# Patient Record
Sex: Female | Born: 1960 | Race: White | Hispanic: No | Marital: Single | Smoking: Never smoker
Health system: Southern US, Community
[De-identification: ages and names within clinical notes are randomized; demographics above are authoritative.]

## PROBLEM LIST (undated history)

## (undated) DIAGNOSIS — B029 Zoster without complications: Secondary | ICD-10-CM

## (undated) DIAGNOSIS — F32A Depression, unspecified: Secondary | ICD-10-CM

## (undated) DIAGNOSIS — T7840XA Allergy, unspecified, initial encounter: Secondary | ICD-10-CM

## (undated) DIAGNOSIS — F329 Major depressive disorder, single episode, unspecified: Secondary | ICD-10-CM

## (undated) DIAGNOSIS — K635 Polyp of colon: Secondary | ICD-10-CM

## (undated) DIAGNOSIS — E0500 Thyrotoxicosis with diffuse goiter without thyrotoxic crisis or storm: Secondary | ICD-10-CM

## (undated) HISTORY — DX: Thyrotoxicosis with diffuse goiter without thyrotoxic crisis or storm: E05.00

## (undated) HISTORY — DX: Zoster without complications: B02.9

## (undated) HISTORY — DX: Major depressive disorder, single episode, unspecified: F32.9

## (undated) HISTORY — DX: Depression, unspecified: F32.A

## (undated) HISTORY — DX: Polyp of colon: K63.5

## (undated) HISTORY — DX: Allergy, unspecified, initial encounter: T78.40XA

---

## 2001-05-16 ENCOUNTER — Other Ambulatory Visit: Admission: RE | Admit: 2001-05-16 | Discharge: 2001-05-16 | Payer: Self-pay | Admitting: *Deleted

## 2008-03-26 ENCOUNTER — Other Ambulatory Visit: Admission: RE | Admit: 2008-03-26 | Discharge: 2008-03-26 | Payer: Self-pay | Admitting: Obstetrics and Gynecology

## 2008-04-03 ENCOUNTER — Encounter (HOSPITAL_COMMUNITY): Admission: RE | Admit: 2008-04-03 | Discharge: 2008-07-02 | Payer: Self-pay | Admitting: Endocrinology

## 2008-04-21 ENCOUNTER — Ambulatory Visit (HOSPITAL_COMMUNITY): Admission: RE | Admit: 2008-04-21 | Discharge: 2008-04-21 | Payer: Self-pay | Admitting: Endocrinology

## 2009-01-27 ENCOUNTER — Emergency Department (HOSPITAL_COMMUNITY): Admission: EM | Admit: 2009-01-27 | Discharge: 2009-01-27 | Payer: Self-pay | Admitting: Family Medicine

## 2010-06-15 LAB — HCG, SERUM, QUALITATIVE

## 2010-12-06 ENCOUNTER — Other Ambulatory Visit: Payer: Self-pay | Admitting: Occupational Medicine

## 2010-12-06 ENCOUNTER — Ambulatory Visit: Payer: Self-pay

## 2010-12-06 DIAGNOSIS — R52 Pain, unspecified: Secondary | ICD-10-CM

## 2011-02-14 ENCOUNTER — Ambulatory Visit (INDEPENDENT_AMBULATORY_CARE_PROVIDER_SITE_OTHER): Payer: BC Managed Care – PPO | Admitting: Medical

## 2011-02-14 ENCOUNTER — Encounter: Payer: Self-pay | Admitting: Medical

## 2011-02-14 DIAGNOSIS — Z0000 Encounter for general adult medical examination without abnormal findings: Secondary | ICD-10-CM | POA: Insufficient documentation

## 2011-02-14 DIAGNOSIS — Z1239 Encounter for other screening for malignant neoplasm of breast: Secondary | ICD-10-CM

## 2011-02-14 NOTE — Progress Notes (Signed)
Subjective:   HPI  Lydia Tanner is a 50 y.o. female who presents for a complete physical.  Here as a new patient today. Referred by patient Pervis Hocking, RN.  She is a Presenter, broadcasting.  She hasn't had primary care provider routinely.  She does and has seen Dr. Talmage Nap for quite sometime for hx/o thyroid disease.  She has never had a mammogram and is not UTD on pap smear.  She is menstruating today though.  She notes hx/o normal pap smears.  She notes 1 prior pregnancy that ended with TAB.  No other gyn history. Last eye doctor visit over a years ago. Last flu vaccine 10/12.  She has had Tdap in last 10 years.    Reviewed their medical, surgical, family, social, medication, and allergy history and updated chart as appropriate.  Past Medical History  Diagnosis Date  . Allergy   . Depression   . Grave disease     hx/o radioactive iodine treatment; Dr. Talmage Nap    Past Surgical History  Procedure Date  . Therapeutic abortion     Family History  Problem Relation Age of Onset  . Heart disease Mother     CABG  . Hypertension Mother   . Hyperlipidemia Mother   . COPD Mother   . Hypothyroidism Mother   . Dysphagia Father   . Gout Father   . Hypertension Father   . Hypertension Sister   . Hypothyroidism Sister   . Stroke Maternal Grandmother   . Cancer Neg Hx     History   Social History  . Marital Status: Single    Spouse Name: N/A    Number of Children: N/A  . Years of Education: N/A   Occupational History  . Child psychotherapist, pediatrics [employer]   Social History Main Topics  . Smoking status: Never Smoker   . Smokeless tobacco: Not on file  . Alcohol Use: No  . Drug Use: No  . Sexually Active: Not on file   Other Topics Concern  . Not on file   Social History Narrative   Single, no children, exercising, hobbies - scrapbooking, loves animals, family raises Labor[place]    No current outpatient prescriptions on file prior to visit.     Allergies  Allergen Reactions  . Synthroid Rash   Review of Systems Constitutional: -fever, -chills, -sweats, +unexpected weight change, -anorexia, -fatigue Allergy: -sneezing, -itching, -congestion Dermatology: denies changing moles, rash, lumps, new worrisome lesions ENT: -runny nose, -ear pain, -sore throat, -hoarseness, -sinus pain, -teeth pain, -tinnitus, -hearing loss, -epistaxis Cardiology:  -chest pain, -palpitations, -edema, -orthopnea, -paroxysmal nocturnal dyspnea Respiratory: -cough, -shortness of breath, -dyspnea on exertion, -wheezing, -hemoptysis Gastroenterology: -abdominal pain, -nausea, -vomiting, -diarrhea, -constipation, -blood in stool, -changes in bowel movement, -dysphagia Hematology: -bleeding or bruising problems Musculoskeletal: -arthralgias, -myalgias, -joint swelling, -back pain, -neck pain, -cramping, -gait changes Ophthalmology: -vision changes, -eye redness, -itching, -discharge Urology: -dysuria, -difficulty urinating, -hematuria, -urinary frequency, -urgency, incontinence Neurology: -headache, -weakness, -tingling, -numbness, -speech abnormality, -memory loss, -falls, -dizziness Psychology:  +depressed mood, -agitation, -sleep problems      Objective:   Physical Exam  Filed Vitals:   02/14/11 1355  BP: 128/80  Pulse: 68  Temp: 97.9 F (36.6 C)  Resp: 16    General appearance: alert, no distress, WD/WN, overweight white female Skin: no worrisome lesions, few scattered benign lesions HEENT: normocephalic, conjunctiva/corneas normal, sclerae anicteric, PERRLA, EOMi, nares patent, no discharge or erythema, pharynx normal Oral cavity: MMM,  tongue normal, teeth normal Neck: supple, no lymphadenopathy, no thyromegaly, no masses, normal ROM, no bruits Chest: non tender, normal shape and expansion Heart: RRR, normal S1, S2, no murmurs Lungs: CTA bilaterally, no wheezes, rhonchi, or rales Abdomen: +bs, soft, non tender, non distended, no masses,  no hepatomegaly, no splenomegaly, no bruits Back: non tender, normal ROM, no scoliosis Musculoskeletal: upper extremities non tender, no obvious deformity, normal ROM throughout, lower extremities non tender, no obvious deformity, normal ROM throughout Extremities: no edema, no cyanosis, no clubbing Pulses: 2+ symmetric, upper and lower extremities, normal cap refill Neurological: alert, oriented x 3, CN2-12 intact, strength normal upper extremities and lower extremities, sensation normal throughout, DTRs 2+ throughout, no cerebellar signs, gait normal Psychiatric: normal affect, behavior normal, pleasant  Breast/gyn - deferred   Assessment and Plan :     Encounter Diagnoses  Name Primary?  . General medical examination Yes  . Screening for breast cancer    Physical exam - discussed healthy lifestyle, diet, exercise, preventative care, vaccinations, and addressed their concerns.  Handout given.  Advised she work on diet and exercise to lose weight.   Discussed strategies for this. Return this week for fasting labs.  Return soon for breast and pelvic exam/pap.  We will go ahead and set her up for first screening mammogram.

## 2011-02-14 NOTE — Patient Instructions (Signed)

## 2011-02-17 ENCOUNTER — Other Ambulatory Visit: Payer: Self-pay

## 2011-02-17 ENCOUNTER — Other Ambulatory Visit: Payer: Self-pay | Admitting: Family Medicine

## 2011-02-18 ENCOUNTER — Other Ambulatory Visit: Payer: BC Managed Care – PPO

## 2011-02-18 LAB — COMPREHENSIVE METABOLIC PANEL
ALT: 15 U/L (ref 0–35)
Albumin: 4 g/dL (ref 3.5–5.2)
Alkaline Phosphatase: 77 U/L (ref 39–117)
CO2: 23 mEq/L (ref 19–32)
Glucose, Bld: 85 mg/dL (ref 70–99)
Potassium: 4.2 mEq/L (ref 3.5–5.3)
Sodium: 138 mEq/L (ref 135–145)
Total Protein: 6.4 g/dL (ref 6.0–8.3)

## 2011-02-18 LAB — LIPID PANEL
Cholesterol: 182 mg/dL (ref 0–200)
Total CHOL/HDL Ratio: 2.7 Ratio

## 2011-02-19 LAB — CBC WITH DIFFERENTIAL/PLATELET
Basophils Absolute: 0 K/uL (ref 0.0–0.1)
Basophils Relative: 1 % (ref 0–1)
Eosinophils Absolute: 0.2 K/uL (ref 0.0–0.7)
Eosinophils Relative: 3 % (ref 0–5)
HCT: 28.6 % — ABNORMAL LOW (ref 36.0–46.0)
Hemoglobin: 8.2 g/dL — ABNORMAL LOW (ref 12.0–15.0)
Lymphocytes Relative: 32 % (ref 12–46)
Lymphs Abs: 2.2 K/uL (ref 0.7–4.0)
MCH: 22.2 pg — ABNORMAL LOW (ref 26.0–34.0)
MCHC: 28.7 g/dL — ABNORMAL LOW (ref 30.0–36.0)
MCV: 77.3 fL — ABNORMAL LOW (ref 78.0–100.0)
Monocytes Absolute: 0.4 K/uL (ref 0.1–1.0)
Monocytes Relative: 6 % (ref 3–12)
Neutro Abs: 4 K/uL (ref 1.7–7.7)
Neutrophils Relative %: 58 % (ref 43–77)
Platelets: 483 K/uL — ABNORMAL HIGH (ref 150–400)
RBC: 3.7 MIL/uL — ABNORMAL LOW (ref 3.87–5.11)
RDW: 18.9 % — ABNORMAL HIGH (ref 11.5–15.5)
WBC: 6.9 K/uL (ref 4.0–10.5)

## 2011-02-19 LAB — URINALYSIS
Bilirubin Urine: NEGATIVE
Glucose, UA: NEGATIVE mg/dL
Hgb urine dipstick: NEGATIVE
Ketones, ur: NEGATIVE mg/dL
Leukocytes, UA: NEGATIVE
Nitrite: NEGATIVE
Protein, ur: NEGATIVE mg/dL
Specific Gravity, Urine: <1.005 — ABNORMAL LOW (ref 1.005–1.030)
Urobilinogen, UA: 0.2 mg/dL (ref 0.0–1.0)
pH: 6.5 (ref 5.0–8.0)

## 2011-03-03 ENCOUNTER — Encounter: Payer: Self-pay | Admitting: Family Medicine

## 2011-03-03 ENCOUNTER — Ambulatory Visit (INDEPENDENT_AMBULATORY_CARE_PROVIDER_SITE_OTHER): Payer: BC Managed Care – PPO | Admitting: Family Medicine

## 2011-03-03 VITALS — BP 130/86 | HR 72 | Ht 69.0 in | Wt 208.0 lb

## 2011-03-03 DIAGNOSIS — D509 Iron deficiency anemia, unspecified: Secondary | ICD-10-CM | POA: Insufficient documentation

## 2011-03-03 DIAGNOSIS — Z01419 Encounter for gynecological examination (general) (routine) without abnormal findings: Secondary | ICD-10-CM

## 2011-03-03 NOTE — Progress Notes (Signed)
Patient presents for breast/pelvic exam, as she was on her menstrual cycle when she had her physical with Vincenza Hews.  She hasn't heard results of her labwork done 12/21.  Her last pap was about a year and a half ago.  Denies any history of abnormal paps.  Not currently in a sexual relationship, last was about 10 years ago.  +fatigue, feeling cold Heavy periods, especially the last 6 cycles.  She has in the past contributed her heavy cycles to her thyroid, when it has been out of whack.  Her thyroid is monitored by Dr. Talmage Nap, last checked the end of November, and it was fine (re-checked after dose adjustment).  Denies vaginal discharge, odor, itch.  Denies urinary complaints.  Moods have been good (restarted Zoloft when moods out of whack related to thyroid, doing better now--thinks she will come off it at some point soon).  Past Medical History  Diagnosis Date  . Allergy   . Depression   . Grave disease     hx/o radioactive iodine treatment; Dr. Talmage Nap    History reviewed. No pertinent past surgical history.  History   Social History  . Marital Status: Single    Spouse Name: N/A    Number of Children: N/A  . Years of Education: N/A   Occupational History  . 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 Smoking status: Never Smoker   . Smokeless tobacco: Never Used  . Alcohol Use: No  . Drug Use: No  . Sexually Active: Not Currently   Other Topics Concern  . Not on file   Social History Narrative   Single, no children, exercising, hobbies - scrapbooking, loves animals, family raises [place]    Family History  Problem Relation Age of Onset  . Heart disease Mother     CABG  . Hypertension Mother   . COPD Mother   . Hypothyroidism Mother   . Dysphagia Father   . Gout Father   . Hypertension Father   . Hypertension Sister   . Hypothyroidism Sister   . Stroke Maternal Grandmother   . Cancer Neg Hx    Current Outpatient Prescriptions  on File Prior to Visit  Medication Sig Dispense Refill  . sertraline (ZOLOFT) 50 MG tablet Take 50 mg by mouth daily.        Marland Kitchen thyroid (ARMOUR) 90 MG tablet Take 90 mg by mouth daily.         Allergies  Allergen Reactions  . Synthroid Rash   ROS:  Denies fever, URI symptoms, shortness of breath, chest pain, GI complaints or other concerns.  See HPI  PHYSICAL EXAM: BP 130/86  Pulse 72  Ht 5\' 9"  (1.753 m)  Wt 208 lb (94.348 kg)  BMI 30.72 kg/m2  LMP 02/14/2011 Well developed, pleasant female in no distress Breast:  Normal exam--no nipple discharge, inversion, no breast mass or axillary lymphadenopathy. External genitalia normal without lesions.  Normal bimanual exam--uterus and adnexa normal without masses or tenderness, no cervical motion tenderness.  Pap not performed. Rectal exam: normal sphincter tone, no mass.  Small smear of light brown stool, heme negative.  ASSESSMENT/PLAN: 1. Gynecological examination    2. Iron deficiency anemia, unspecified  CBC with Differential, Ferritin    Iron deficiency anemia--most likely related to heavy/frequent menstrual cycles.  Will give hemoccult cards to role out GI source of bleeding.  If +, will need referral for colonoscopy.  Start iron tablets twice daily with food or  OJ.  May need stool softeners if causes significant constipation.  Re-check labs in 1 month

## 2011-03-03 NOTE — Patient Instructions (Addendum)
Iron deficiency anemia--most likely related to heavy/frequent menstrual cycles.  Will give hemoccult cards to role out GI source of bleeding.   Start iron tablets twice daily with food or OJ.  May need stool softeners if causes significant constipation.  Re-check labs in 1 month   HEALTH MAINTENANCE RECOMMENDATIONS:  It is recommended that you get at least 30 minutes of aerobic exercise at least 5 days/week (for weight loss, you may need as much as 60-90 minutes). This can be any activity that gets your heart rate up. This can be divided in 10-15 minute intervals if needed, but try and build up your endurance at least once a week.  Weight bearing exercise is also recommended twice weekly.  Eat a healthy diet with lots of vegetables, fruits and fiber.  "Colorful" foods have a lot of vitamins (ie green vegetables, tomatoes, red peppers, etc).  Limit sweet tea, regular sodas and alcoholic beverages, all of which has a lot of calories and sugar.  Up to 1 alcoholic drink daily may be beneficial for women (unless trying to lose weight, watch sugars).  Drink a lot of water.  Calcium recommendations are 1200-1500 mg daily (1500 mg for postmenopausal women or women without ovaries), and vitamin D 1000 IU daily.  This should be obtained from diet and/or supplements (vitamins), and calcium should not be taken all at once, but in divided doses.  Monthly self breast exams and yearly mammograms for women over the age of 61 is recommended.  Sunscreen of at least SPF 30 should be used on all sun-exposed parts of the skin when outside between the hours of 10 am and 4 pm (not just when at beach or pool, but even with exercise, golf, tennis, and yard work!)  Use a sunscreen that says "broad spectrum" so it covers both UVA and UVB rays, and make sure to reapply every 1-2 hours.  Remember to change the batteries in your smoke detectors when changing your clock times in the spring and fall.  Use your seat belt every  time you are in a car, and please drive safely and not be distracted with cell phones and texting while driving.

## 2011-03-07 ENCOUNTER — Telehealth: Payer: Self-pay | Admitting: Medical

## 2011-03-07 NOTE — Telephone Encounter (Signed)
Message copied by Jac Canavan on Mon Mar 07, 2011 10:54 AM ------      Message from: KNAPP, EVE      Created: Thu Mar 03, 2011  9:18 PM      Regarding: lab results       Patient saw me today for breast/pelvic exam.  She hadn't heard about her lab results from her CPE with you. Looks like she had labs done 12/21 showing pretty significant iron deficiency anemia.  Just wanted to let you know that I addressed this with her (but wonder why not already addressed...)

## 2011-03-07 NOTE — Telephone Encounter (Signed)
I called to apologize about not getting back to her regarding her lab results.  This was due to computer issues I had which has now been resolved.  She will f/u in 48mo on anemia and iron.

## 2011-03-10 ENCOUNTER — Ambulatory Visit
Admission: RE | Admit: 2011-03-10 | Discharge: 2011-03-10 | Disposition: A | Discharge status: Home / Self Care | Payer: BC Managed Care – PPO | Source: Ambulatory Visit | Attending: Medical | Admitting: Medical

## 2011-03-10 ENCOUNTER — Ambulatory Visit: Admission: RE | Admit: 2011-03-10 | Payer: BC Managed Care – PPO | Source: Ambulatory Visit

## 2011-03-10 ENCOUNTER — Other Ambulatory Visit: Payer: Self-pay | Admitting: Medical

## 2011-03-10 DIAGNOSIS — Z1231 Encounter for screening mammogram for malignant neoplasm of breast: Secondary | ICD-10-CM

## 2011-03-15 ENCOUNTER — Other Ambulatory Visit: Payer: Self-pay | Admitting: Medical

## 2011-03-15 DIAGNOSIS — R928 Other abnormal and inconclusive findings on diagnostic imaging of breast: Secondary | ICD-10-CM

## 2011-03-23 ENCOUNTER — Ambulatory Visit
Admission: RE | Admit: 2011-03-23 | Discharge: 2011-03-23 | Disposition: A | Discharge status: Home / Self Care | Payer: BC Managed Care – PPO | Source: Ambulatory Visit | Attending: Medical | Admitting: Medical

## 2011-03-23 DIAGNOSIS — R928 Other abnormal and inconclusive findings on diagnostic imaging of breast: Secondary | ICD-10-CM

## 2011-03-23 LAB — HM MAMMOGRAPHY

## 2011-03-28 ENCOUNTER — Encounter: Payer: Self-pay | Admitting: Internal Medicine

## 2011-04-07 ENCOUNTER — Other Ambulatory Visit: Payer: Self-pay

## 2012-01-16 ENCOUNTER — Telehealth: Payer: Self-pay | Admitting: Internal Medicine

## 2012-01-16 NOTE — Telephone Encounter (Signed)
Pt called and asked for an update for the form completion. Lydia Tanner had filled out the rest and i have faxed it. She is scheduled with Dr. Lynelle Doctor in February with her physical. And her anemia is due to heavy periods with premenopause.

## 2012-01-16 NOTE — Telephone Encounter (Signed)
Message copied by Joslyn Hy on Mon Jan 16, 2012  2:31 PM ------      Message from: Aleen Campi, DAVID S      Created: Thu Jan 12, 2012  9:26 PM       chandra - i received letter from pt needing form completed.   i signed it.  Please complete the rest and fax back.   She is due back for yearly physical by the end of December/first of January.  She was actually due back earlier in the year per our phone call to recheck anemia.   Either way, go ahead and schedule for physical, fasting labs.

## 2012-04-12 ENCOUNTER — Encounter: Payer: Self-pay | Admitting: Family Medicine

## 2012-05-16 ENCOUNTER — Encounter: Payer: Self-pay | Admitting: Family Medicine

## 2012-05-16 ENCOUNTER — Ambulatory Visit (INDEPENDENT_AMBULATORY_CARE_PROVIDER_SITE_OTHER): Payer: BC Managed Care – PPO | Admitting: Family Medicine

## 2012-05-16 ENCOUNTER — Other Ambulatory Visit (HOSPITAL_COMMUNITY)
Admission: RE | Admit: 2012-05-16 | Discharge: 2012-05-16 | Disposition: A | Discharge status: Home / Self Care | Payer: BC Managed Care – PPO | Source: Ambulatory Visit | Attending: Family Medicine | Admitting: Family Medicine

## 2012-05-16 VITALS — BP 128/84 | HR 68 | Ht 69.25 in | Wt 195.0 lb

## 2012-05-16 DIAGNOSIS — Z1151 Encounter for screening for human papillomavirus (HPV): Secondary | ICD-10-CM | POA: Insufficient documentation

## 2012-05-16 DIAGNOSIS — Z0000 Encounter for general adult medical examination without abnormal findings: Secondary | ICD-10-CM

## 2012-05-16 DIAGNOSIS — Z23 Encounter for immunization: Secondary | ICD-10-CM

## 2012-05-16 DIAGNOSIS — Z01419 Encounter for gynecological examination (general) (routine) without abnormal findings: Secondary | ICD-10-CM | POA: Insufficient documentation

## 2012-05-16 LAB — POCT URINALYSIS DIPSTICK
Bilirubin, UA: NEGATIVE
Glucose, UA: NEGATIVE
Leukocytes, UA: NEGATIVE
Nitrite, UA: NEGATIVE
Urobilinogen, UA: NEGATIVE

## 2012-05-16 LAB — GLUCOSE, RANDOM: Glucose, Bld: 82 mg/dL (ref 70–99)

## 2012-05-16 LAB — CBC WITH DIFFERENTIAL/PLATELET
Basophils Relative: 1 % (ref 0–1)
Eosinophils Absolute: 0.1 10*3/uL (ref 0.0–0.7)
Hemoglobin: 8 g/dL — ABNORMAL LOW (ref 12.0–15.0)
Lymphs Abs: 1.9 10*3/uL (ref 0.7–4.0)
MCH: 21.1 pg — ABNORMAL LOW (ref 26.0–34.0)
Monocytes Relative: 7 % (ref 3–12)
Neutro Abs: 4.5 10*3/uL (ref 1.7–7.7)
Neutrophils Relative %: 63 % (ref 43–77)
Platelets: 427 10*3/uL — ABNORMAL HIGH (ref 150–400)
RBC: 3.79 MIL/uL — ABNORMAL LOW (ref 3.87–5.11)

## 2012-05-16 LAB — FERRITIN: Ferritin: 1 ng/mL — ABNORMAL LOW (ref 10–291)

## 2012-05-16 NOTE — Progress Notes (Signed)
Chief Complaint  Patient presents with  . Annual Exam    fasting CPE with pap. Would like to discuss the changes in her menstrual cycle.   Lydia Tanner is a 52 y.o. female who presents for a complete physical.  She has the following concerns:  2.5 years ago her periods starting getting very heavy (just for 1-2 days of cycle, but VERY heavy).  She thought it was related to her thyroid and changes with age.  Periods now sometimes last 7 days, heavy for 1-1.5 days, and isn't as heavy as in the past.  Periods are still regular, monthly.  Menarche was late, 1 month prior to age 15.  Iron deficiency anemia--never returned for f/u labs last year.  She took iron supplement for 3 months after last visit (not taking for about 9 months) but gets iron in [facility] and has increased iron in her diet.  No longer is complaining of low energy, feeling cold, or significant fatigue (all improved).  Hypothyroidism--managed by Dr. Talmage Nap.  Dr. Talmage Nap also prescribes her zoloft, and moods have been good.  Health Maintenance: There is no immunization history on file for this patient. Last tetanus likely >10 years ago, can't recall Gets flu shots yearly through work Last Pap smear:  3 years ago Last mammogram: 03/2011 (had to r/s this year's due to weather/snow) Last colonoscopy: never Last DEXA: never Dentist: twice yearly Ophtho: never Exercise: 4x/week with personal trainer  Past Medical History  Diagnosis Date  . Allergy   . Depression   . Grave disease     hx/o radioactive iodine treatment; Dr. Talmage Nap  . Shingles age 63    History reviewed. No pertinent past surgical history.  History   Social History  . Marital Status: Single    Spouse Name: N/A    Number of Children: N/A  . Years of Education: N/A   Occupational History  . 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 Smoking status: Never Smoker   . Smokeless tobacco: Never Used  . Alcohol Use: Yes   Comment: 3-5 drinks per week, maybe.  . Drug Use: No  . Sexually Active: Not Currently   Other Topics Concern  . Not on file   Social History Narrative   Single, no children, exercising, hobbies - scrapbooking, loves animals, family raises Labradors    Family History  Problem Relation Age of Onset  . Heart disease Mother     CABG  . Hypertension Mother   . COPD Mother   . Hypothyroidism Mother   . Dysphagia Father   . Gout Father   . Hypertension Father   . Hypertension Sister   . Hypothyroidism Sister   . Stroke Maternal Grandmother   . Cancer Neg Hx   . Diabetes Neg Hx   . Healthy Brother   . Healthy Brother     Current outpatient prescriptions:Multiple Vitamins-Minerals (MULTIVITAMIN WITH MINERALS) tablet, Take 1 tablet by mouth daily.  , Disp: , Rfl: ;  sertraline (ZOLOFT) 50 MG tablet, Take 50 mg by mouth daily.  , Disp: , Rfl: ;  thyroid (ARMOUR) 90 MG tablet, Take 90 mg by mouth daily. , Disp: , Rfl:   Allergies  Allergen Reactions  . Levothyroxine Sodium Rash   ROS:  The patient denies anorexia, fever, headaches,  vision changes, decreased hearing, ear pain, sore throat, breast concerns, chest pain, palpitations, dizziness, syncope, dyspnea on exertion, cough, swelling, nausea, vomiting, diarrhea, constipation, abdominal pain, melena,  hematochezia, indigestion/heartburn, hematuria, incontinence, dysuria, irregular menstrual cycles, vaginal discharge, odor or itch, genital lesions, joint pains, numbness, tingling, weakness, tremor, suspicious skin lesions, depression, anxiety, abnormal bleeding/bruising, or enlarged lymph nodes. She is down 13 pounds since her last visit (lost more but regained). Slight rash periodically on her legs--has appt with her dermatologist later this week. Sensitive to soaps, and recently slept somewhere else with their sheets.  PHYSICAL EXAM: BP 128/84  Pulse 68  Ht 5' 9.25" (1.759 m)  Wt 195 lb (88.451 kg)  BMI 28.59 kg/m2  LMP  05/04/2012  General Appearance:    Alert, cooperative, no distress, appears stated age  Head:    Normocephalic, without obvious abnormality, atraumatic  Eyes:    PERRL, conjunctiva/corneas clear, EOM's intact, fundi    benign  Ears:    Normal TM's and external ear canals  Nose:   Nares normal, mucosa normal, no drainage or sinus   tenderness  Throat:   Lips, mucosa, and tongue normal; teeth and gums normal  Neck:   Supple, no lymphadenopathy;  thyroid:  no   enlargement/tenderness/nodules; no carotid   bruit or JVD  Back:    Spine nontender, no curvature, ROM normal, no CVA     tenderness  Lungs:     Clear to auscultation bilaterally without wheezes, rales or     ronchi; respirations unlabored  Chest Wall:    No tenderness or deformity   Heart:    Regular rate and rhythm, S1 and S2 normal, no murmur, rub   or gallop  Breast Exam:    No tenderness, masses, or nipple discharge or inversion.      No axillary lymphadenopathy  Abdomen:     Soft, non-tender, nondistended, normoactive bowel sounds,    no masses, no hepatosplenomegaly  Genitalia:    Normal external genitalia without lesions.  BUS and vagina normal; cervix without lesions, or cervical motion tenderness. No abnormal vaginal discharge.  Uterus and adnexa not enlarged, nontender, no masses.  Pap performed  Rectal:    Normal tone, no masses or tenderness; guaiac negative stool  Extremities:   No clubbing, cyanosis or edema  Pulses:   2+ and symmetric all extremities  Skin:   Skin color, texture, turgor normal, no suspicious lesions  Lymph nodes:   Cervical, supraclavicular, and axillary nodes normal  Neurologic:   CNII-XII intact, normal strength, sensation and gait; reflexes 2+ and symmetric throughout          Psych:   Normal mood, affect, hygiene and grooming.    ASSESSMENT/PLAN:  Routine general medical examination at a health care facility - Plan: Visual acuity screening, POCT Urinalysis Dipstick, Glucose, random, Cytology -  PAP  Iron deficiency anemia, unspecified - Plan: CBC with Differential, Ferritin  Need for Tdap vaccination - Plan: Tdap vaccine greater than or equal to 7yo IM  Other malaise and fatigue - Plan: Vitamin D 25 hydroxy  Colon cancer screening - Plan: Ambulatory referral to Gastroenterology  Depression--well controlled.  Discussed possibility of needing increased dose in future if gets any recurrent symptoms of depression (discussed that this sometimes occurs after being on med for a long time, at low dose).  Hypothyroidism--controlled, per Dr. Talmage Nap  Anemia--improved per symptoms.  Still has some heavy periods, but better.  Due for labs today.  Discussed monthly self breast exams and yearly mammograms after the age of 73; at least 30 minutes of aerobic activity at least 5 days/week; proper sunscreen use reviewed; healthy diet, including goals of  calcium and vitamin D intake and alcohol recommendations (less than or equal to 1 drink/day) reviewed; regular seatbelt use; changing batteries in smoke detectors.  Immunization recommendations discussed--TdaP today.  Colonoscopy recommendations reviewed--age 89.  Will refer to get scheduled for September/October.  Shingles vaccine--check with insurance if covered at age 74 vs 63

## 2012-05-16 NOTE — Patient Instructions (Addendum)
HEALTH MAINTENANCE RECOMMENDATIONS:  It is recommended that you get at least 30 minutes of aerobic exercise at least 5 days/week (for weight loss, you may need as much as 60-90 minutes). This can be any activity that gets your heart rate up. This can be divided in 10-15 minute intervals if needed, but try and build up your endurance at least once a week.  Weight bearing exercise is also recommended twice weekly.  Eat a healthy diet with lots of vegetables, fruits and fiber.  "Colorful" foods have a lot of vitamins (ie green vegetables, tomatoes, red peppers, etc).  Limit sweet tea, regular sodas and alcoholic beverages, all of which has a lot of calories and sugar.  Up to 1 alcoholic drink daily may be beneficial for women (unless trying to lose weight, watch sugars).  Drink a lot of water.  Calcium recommendations are 1200-1500 mg daily (1500 mg for postmenopausal women or women without ovaries), and vitamin D 1000 IU daily.  This should be obtained from diet and/or supplements (vitamins), and calcium should not be taken all at once, but in divided doses.  Monthly self breast exams and yearly mammograms for women over the age of 67 is recommended.  Sunscreen of at least SPF 30 should be used on all sun-exposed parts of the skin when outside between the hours of 10 am and 4 pm (not just when at beach or pool, but even with exercise, golf, tennis, and yard work!)  Use a sunscreen that says "broad spectrum" so it covers both UVA and UVB rays, and make sure to reapply every 1-2 hours.  Remember to change the batteries in your smoke detectors when changing your clock times in the spring and fall.  Use your seat belt every time you are in a car, and please drive safely and not be distracted with cell phones and texting while driving.  Shingles vaccine--check with insurance if covered at age 79 vs 66. If covered at 50, you can set up a nurse visit for vaccine (needs to be at least 1 month separated from  other vaccines).  Set up your mammogram and routine eye exam

## 2012-05-17 ENCOUNTER — Other Ambulatory Visit: Payer: Self-pay | Admitting: *Deleted

## 2012-05-22 ENCOUNTER — Encounter: Payer: Self-pay | Admitting: Family Medicine

## 2012-07-25 ENCOUNTER — Other Ambulatory Visit: Payer: Self-pay

## 2012-11-19 ENCOUNTER — Ambulatory Visit (AMBULATORY_SURGERY_CENTER): Payer: Self-pay | Admitting: *Deleted

## 2012-11-19 VITALS — Ht 69.0 in | Wt 196.0 lb

## 2012-11-19 DIAGNOSIS — Z1211 Encounter for screening for malignant neoplasm of colon: Secondary | ICD-10-CM

## 2012-11-19 MED ORDER — MOVIPREP 100 G PO SOLR: 1.0000 | Freq: Once | ORAL | Status: DC

## 2012-11-19 NOTE — Progress Notes (Signed)
Patient denies any allergies to eggs or soy. Patient denies any problems with anesthesia.  

## 2012-11-20 ENCOUNTER — Encounter: Payer: Self-pay | Admitting: Gastroenterology

## 2012-11-27 ENCOUNTER — Encounter: Payer: Self-pay | Admitting: Gastroenterology

## 2012-11-27 ENCOUNTER — Ambulatory Visit (AMBULATORY_SURGERY_CENTER): Payer: BC Managed Care – PPO | Admitting: Gastroenterology

## 2012-11-27 VITALS — BP 126/86 | HR 69 | Temp 98.3°F | Resp 24 | Ht 69.0 in | Wt 196.0 lb

## 2012-11-27 DIAGNOSIS — D126 Benign neoplasm of colon, unspecified: Secondary | ICD-10-CM

## 2012-11-27 DIAGNOSIS — Z1211 Encounter for screening for malignant neoplasm of colon: Secondary | ICD-10-CM

## 2012-11-27 DIAGNOSIS — K635 Polyp of colon: Secondary | ICD-10-CM

## 2012-11-27 HISTORY — DX: Polyp of colon: K63.5

## 2012-11-27 HISTORY — PX: COLONOSCOPY: SHX174

## 2012-11-27 MED ORDER — SODIUM CHLORIDE 0.9 % IV SOLN: 500.0000 mL | INTRAVENOUS | Status: DC

## 2012-11-27 NOTE — Op Note (Signed)
[facility] [address] [place], [address]   COLONOSCOPY PROCEDURE REPORT  PATIENT: Lydia Tanner, Lydia Tanner  MR#: [id number] BIRTHDATE: Jun 12, 1960 , 52  yrs. old GENDER: Female ENDOSCOPIST: Mardella Layman, MD, [facility] REFERRED Edison Simon, M.D. PROCEDURE DATE:  11/27/2012 PROCEDURE:   Colonoscopy with snare polypectomy First Screening Colonoscopy - Avg.  risk and is 50 yrs.  old or older Yes.  Prior Negative Screening - Now for repeat screening. N/A  History of Adenoma - Now for follow-up colonoscopy & has been > or = to 3 yrs.  N/A  Polyps Removed Today? Yes. ASA CLASS:   Class II INDICATIONS:average risk screening. MEDICATIONS: Propofol (Diprivan) 550 mg IV  DESCRIPTION OF PROCEDURE:   After the risks benefits and alternatives of the procedure were thoroughly explained, informed consent was obtained.  A digital rectal exam revealed no abnormalities of the rectum.   The LB [id number]  endoscope was introduced through the anus and advanced to the cecum, which was identified by both the appendix and ileocecal valve. No adverse events experienced.   The quality of the prep was excellent, using MoviPrep  The instrument was then slowly withdrawn as the colon was fully examined.      COLON FINDINGS: The colon was redundant.  Manual abdominal counter-pressure was used to reach the cecum.   A firm flat polyp ranging between 3-37mm in size was found in the rectum.  A polypectomy was performed with a cold snare.  The resection was complete and the polyp tissue was completely retrieved.   The colon was otherwise normal.  There was no diverticulosis, inflammation, polyps or cancers unless previously stated.  Retroflexed views revealed no abnormalities. The time to cecum=6 minutes 21 seconds. Withdrawal time=11 minutes 21 seconds.  The scope was withdrawn and the procedure completed. COMPLICATIONS: There were no complications.  ENDOSCOPIC IMPRESSION: 1.   The colon  was redundant 2.   Flat polyp ranging between 3-28mm in size was found in the rectum; polypectomy was performed with a cold snare 3.   The colon was otherwise normal  RECOMMENDATIONS: 1.  Repeat colonoscopy in 5 years if polyp adenomatous; otherwise 10 years 2.  Await pathology results 3.  Continue current medications   eSigned:  Mardella Layman, MD, [facility] 11/27/2012 10:09 AM   cc:   PATIENT NAME:  [name], [name] MR#: [id number]

## 2012-11-27 NOTE — Progress Notes (Signed)
Report to pacu rn, vss, bbs=clear 

## 2012-11-27 NOTE — Progress Notes (Signed)
Patient did not experience any of the following events: a burn prior to discharge; a fall within the facility; wrong site/side/patient/procedure/implant event; or a hospital transfer or hospital admission upon discharge from the facility. (G8907) Patient did not have preoperative order for IV antibiotic SSI prophylaxis. (G8918)  

## 2012-11-27 NOTE — Progress Notes (Signed)
Called to room to assist during endoscopic procedure.  Patient ID and intended procedure confirmed with present staff. Received instructions for my participation in the procedure from the performing physician.  

## 2012-11-27 NOTE — Patient Instructions (Addendum)
Discharge instructions given with verbal understanding. Handouts on polyps given. Resume previous medications. YOU HAD AN ENDOSCOPIC PROCEDURE TODAY AT THE [facility]: Refer to the procedure report that was given to you for any specific questions about what was found during the examination.  If the procedure report does not answer your questions, please call your gastroenterologist to clarify.  If you requested that your care partner not be given the details of your procedure findings, then the procedure report has been included in a sealed envelope for you to review at your convenience later.  YOU SHOULD EXPECT: Some feelings of bloating in the abdomen. Passage of more gas than usual.  Walking can help get rid of the air that was put into your GI tract during the procedure and reduce the bloating. If you had a lower endoscopy (such as a colonoscopy or flexible sigmoidoscopy) you may notice spotting of blood in your stool or on the toilet paper. If you underwent a bowel prep for your procedure, then you may not have a normal bowel movement for a few days.  DIET: Your first meal following the procedure should be a light meal and then it is ok to progress to your normal diet.  A half-sandwich or bowl of soup is an example of a good first meal.  Heavy or fried foods are harder to digest and may make you feel nauseous or bloated.  Likewise meals heavy in dairy and vegetables can cause extra gas to form and this can also increase the bloating.  Drink plenty of fluids but you should avoid alcoholic beverages for 24 hours.  ACTIVITY: Your care partner should take you home directly after the procedure.  You should plan to take it easy, moving slowly for the rest of the day.  You can resume normal activity the day after the procedure however you should NOT DRIVE or use heavy machinery for 24 hours (because of the sedation medicines used during the test).    SYMPTOMS TO REPORT IMMEDIATELY: A  gastroenterologist can be reached at any hour.  During normal business hours, 8:30 AM to 5:00 PM Monday through Friday, call [phone].  After hours and on weekends, please call the GI answering service at [phone] who will take a message and have the physician on call contact you.   Following lower endoscopy (colonoscopy or flexible sigmoidoscopy):  Excessive amounts of blood in the stool  Significant tenderness or worsening of abdominal pains  Swelling of the abdomen that is new, acute  Fever of 100F or higher  FOLLOW UP: If any biopsies were taken you will be contacted by phone or by letter within the next 1-3 weeks.  Call your gastroenterologist if you have not heard about the biopsies in 3 weeks.  Our staff will call the home number listed on your records the next business day following your procedure to check on you and address any questions or concerns that you may have at that time regarding the information given to you following your procedure. This is a courtesy call and so if there is no answer at the home number and we have not heard from you through the emergency physician on call, we will assume that you have returned to your regular daily activities without incident.  SIGNATURES/CONFIDENTIALITY: You and/or your care partner have signed paperwork which will be entered into your electronic medical record.  These signatures attest to the fact that that the information above on your After Visit Summary has   been reviewed and is understood.  Full responsibility of the confidentiality of this discharge information lies with you and/or your care-partner.  

## 2012-11-28 ENCOUNTER — Telehealth: Payer: Self-pay | Admitting: *Deleted

## 2012-11-28 NOTE — Telephone Encounter (Signed)
  Follow up Call-  Call back number 11/27/2012  Post procedure Call Back phone  # cell [phone]  Permission to leave phone message Yes     No answer,left message.

## 2012-12-03 ENCOUNTER — Encounter: Payer: Self-pay | Admitting: Gastroenterology

## 2012-12-05 ENCOUNTER — Encounter: Payer: Self-pay | Admitting: Family Medicine

## 2013-05-24 ENCOUNTER — Encounter: Payer: Self-pay | Admitting: Internal Medicine

## 2013-05-24 NOTE — Progress Notes (Signed)
Letter sent.
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
## 2013-12-30 ENCOUNTER — Encounter: Payer: Self-pay | Admitting: Family Medicine

## 2018-06-21 ENCOUNTER — Encounter: Payer: Self-pay | Admitting: Family Medicine
